# Patient Record
Sex: Female | Born: 1980 | Race: Black or African American | Hispanic: No | Marital: Married | Smoking: Never smoker
Health system: Southern US, Community
[De-identification: ages and names within clinical notes are randomized; demographics above are authoritative.]

## PROBLEM LIST (undated history)

## (undated) DIAGNOSIS — G43909 Migraine, unspecified, not intractable, without status migrainosus: Secondary | ICD-10-CM

## (undated) HISTORY — DX: Migraine, unspecified, not intractable, without status migrainosus: G43.909

## (undated) HISTORY — PX: KNEE ARTHROSCOPY W/ ACL RECONSTRUCTION: SHX1858

---

## 2019-03-07 ENCOUNTER — Other Ambulatory Visit: Payer: Self-pay

## 2019-03-07 ENCOUNTER — Encounter: Payer: Self-pay | Admitting: Obstetrics and Gynecology

## 2019-03-07 ENCOUNTER — Ambulatory Visit: Payer: Managed Care, Other (non HMO) | Admitting: Obstetrics and Gynecology

## 2019-03-07 VITALS — BP 115/79 | HR 88 | Ht 62.0 in | Wt 189.0 lb

## 2019-03-07 DIAGNOSIS — Z6834 Body mass index (BMI) 34.0-34.9, adult: Secondary | ICD-10-CM

## 2019-03-07 DIAGNOSIS — Z86018 Personal history of other benign neoplasm: Secondary | ICD-10-CM | POA: Diagnosis not present

## 2019-03-07 DIAGNOSIS — Z3169 Encounter for other general counseling and advice on procreation: Secondary | ICD-10-CM | POA: Diagnosis not present

## 2019-03-07 DIAGNOSIS — N926 Irregular menstruation, unspecified: Secondary | ICD-10-CM | POA: Diagnosis not present

## 2019-03-07 NOTE — Patient Instructions (Signed)
Thank you for enrolling in [facility]. Please follow the instructions below to securely access your online medical record. MyChart allows you to send messages to your doctor, view your test results, renew your prescriptions, schedule appointments, and more.  How Do I Sign Up? 1. In your Internet browser, go to [email or web address]. 2. Click on the New  User? link in the Sign In box.  3. Enter your MyChart Access Code exactly as it appears below. You will not need to use this code after you have completed the sign-up process. If you do not sign up before the expiration date, you must request a new code. MyChart Access Code: [email or web address] Expires: 04/21/2019  3:16 PM  4. Enter the last four digits of your Social Security Number (xxxx) and Date of Birth (mm/dd/yyyy) as indicated and click Next. You will be taken to the next sign-up page. 5. Create a MyChart ID. This will be your MyChart login ID and cannot be changed, so think of one that is secure and easy to remember. 6. Create a MyChart password. You can change your password at any time. 7. Enter your Password Reset Question and Answer and click Next. This can be used at a later time if you forget your password.  8. Select your communication preference, and if applicable enter your e-mail address. You will receive e-mail notification when new information is available in MyChart by choosing to receive e-mail notifications and filling in your e-mail. 9. Click Sign In. You can now view your medical record.   Additional Information If you have questions, you can email [email or web address] or call [phone] to talk to our [facility] staff. Remember, MyChart is NOT to be used for urgent needs. For medical emergencies, dial 911.   Preparing for Pregnancy If you are considering becoming pregnant, make an appointment to see your regular health care provider to learn how to prepare for a safe and healthy pregnancy  (preconception care). During a preconception care visit, your health care provider will:  Do a complete physical exam, including a Pap test.  Take a complete medical history.  Give you information, answer your questions, and help you resolve problems. Preconception checklist Medical history  Tell your health care provider about any current or past medical conditions. Your pregnancy or your ability to become pregnant may be affected by chronic conditions, such as diabetes, chronic hypertension, and thyroid problems.  Include your family's medical history as well as your partner's medical history.  Tell your health care provider about any history of STIs (sexually transmitted infections).These can affect your pregnancy. In some cases, they can be passed to your baby. Discuss any concerns that you have about STIs.  If indicated, discuss the benefits of genetic testing. This testing will show whether there are any genetic conditions that may be passed from you or your partner to your baby.  Tell your health care provider about: ? Any problems you have had with conception or pregnancy. ? Any medicines you take. These include vitamins, herbal supplements, and over-the-counter medicines. ? Your history of immunizations. Discuss any vaccinations that you may need. Diet  Ask your health care provider what to include in a healthy diet that has a balance of nutrients. This is especially important when you are pregnant or preparing to become pregnant.  Ask your health care provider to help you reach a healthy weight before pregnancy. ? If you are overweight, you may be at higher risk for certain complications, such as  high blood pressure, diabetes, and preterm birth. ? If you are underweight, you are more likely to have a baby who has a low birth weight. Lifestyle, work, and home  Let your health care provider know: ? About any lifestyle habits that you have, such as alcohol use, drug use, or  smoking. ? About recreational activities that may put you at risk during pregnancy, such as downhill skiing and certain exercise programs. ? Tell your health care provider about any international travel, especially any travel to places with an active [place] virus outbreak. ? About harmful substances that you may be exposed to at work or at home. These include chemicals, pesticides, radiation, or even litter boxes. ? If you do not feel safe at home. Mental health  Tell your health care provider about: ? Any history of mental health conditions, including feelings of depression, sadness, or anxiety. ? Any medicines that you take for a mental health condition. These include herbs and supplements. Home instructions to prepare for pregnancy Lifestyle   Eat a balanced diet. This includes fresh fruits and vegetables, whole grains, lean meats, low-fat dairy products, healthy fats, and foods that are high in fiber. Ask to meet with a nutritionist or registered dietitian for assistance with meal planning and goals.  Get regular exercise. Try to be active for at least 30 minutes a day on most days of the week. Ask your health care provider which activities are safe during pregnancy.  Do not use any products that contain nicotine or tobacco, such as cigarettes and e-cigarettes. If you need help quitting, ask your health care provider.  Do not drink alcohol.  Do not take illegal drugs.  Maintain a healthy weight. Ask your health care provider what weight range is right for you. General instructions  Keep an accurate record of your menstrual periods. This makes it easier for your health care provider to determine your baby's due date.  Begin taking prenatal vitamins and folic acid supplements daily as directed by your health care provider.  Manage any chronic conditions, such as high blood pressure and diabetes, as told by your health care provider. This is important. How do I know that I am pregnant?  You may be pregnant if you have been sexually active and you miss your period. Symptoms of early pregnancy include:  Mild cramping.  Very light vaginal bleeding (spotting).  Feeling unusually tired.  Nausea and vomiting (morning sickness). If you have any of these symptoms and you suspect that you might be pregnant, you can take a home pregnancy test. These tests check for a hormone in your urine (human chorionic gonadotropin, or hCG). A woman's body begins to make this hormone during early pregnancy. These tests are very accurate. Wait until at least the first day after you miss your period to take one. If the test shows that you are pregnant (you get a positive result), call your health care provider to make an appointment for prenatal care. What should I do if I become pregnant?      Make an appointment with your health care provider as soon as you suspect you are pregnant.  Do not use any products that contain nicotine, such as cigarettes, chewing tobacco, and e-cigarettes. If you need help quitting, ask your health care provider.  Do not drink alcoholic beverages. Alcohol is related to a number of birth defects.  Avoid toxic odors and chemicals.  You may continue to have sexual intercourse if it does not cause pain or  other problems, such as vaginal bleeding. This information is not intended to replace advice given to you by your health care provider. Make sure you discuss any questions you have with your health care provider. Document Released: 09/01/2008 Document Revised: 09/21/2017 Document Reviewed: 04/10/2016 Elsevier Interactive Patient Education  2019 [employer] Inc. Uterine Fibroids  Uterine fibroids are lumps of tissue (tumors) in your womb (uterus). They are not cancer (are benign). Most women with this condition do not need treatment. Sometimes fibroids can affect your ability to have children (your fertility). If that happens, you may need surgery to take out the  fibroids. Follow these instructions at home:  Take over-the-counter and prescription medicines only as told by your doctor. Your doctor may suggest NSAIDs (such as aspirin or ibuprofen) to help with pain.  Ask your doctor if you should: ? Take iron pills. ? Eat more foods that have iron in them, such as dark green, leafy vegetables.  If directed, apply heat to your back or belly to reduce pain. Use the heat source that your doctor recommends, such as a moist heat pack or a heating pad. ? Put a towel between your skin and the heat source. ? Leave the heat on for 20-30 minutes. ? Remove the heat if your skin turns bright red. This is especially important if you are unable to feel pain, heat, or cold. You may have a greater risk of getting burned.  Pay close attention to your period (menstrual) cycles. Tell your doctor about any changes, such as: ? A heavier blood flow than usual. ? Needing to use more pads or tampons than normal. ? A change in how many days your period lasts. ? A change in symptoms that come with your period, such as cramps or back pain.  Keep all follow-up visits as told by your doctor. This is important. Your doctor may need to watch your fibroids over time for any changes. Contact a doctor if you:  Have pain that does not get better with medicine or heat, such as pain or cramps in: ? Your back. ? The area between your hip bones (pelvic area). ? Your belly.  Have new bleeding between your periods.  Have more bleeding during or between your periods.  Feel very tired or weak.  Feel light-headed. Get help right away if you:  Pass out (faint).  Have pain in the area between your hip bones that suddenly gets worse.  Have bleeding that soaks a tampon or pad in 30 minutes or less. Summary  Uterine fibroids are lumps of tissue (tumors) in your womb (uterus). They are not cancer.  The only treatment that most women need is taking aspirin or ibuprofen for pain.   Contact a doctor if you have pain or cramps that do not get better with medicine.  Make sure you know what symptoms you should get help for right away. This information is not intended to replace advice given to you by your health care provider. Make sure you discuss any questions you have with your health care provider. Document Released: 10/22/2010 Document Revised: 08/15/2017 Document Reviewed: 08/15/2017 Elsevier Interactive Patient Education  2019 [employer].

## 2019-03-07 NOTE — Progress Notes (Signed)
  Subjective:     Patient ID: Kaitlyn Pierce, female   DOB: 1981/06/12, 38 y.o.   MRN: [id number]  HPI Here to discuss achieving pregnancy.has been married for 2 years. They are having sex daily if not multiple times a day around suspected ovulation. Has been trying to achieve for the last 3 months. Spouse has 2 children from previous marriage and he has no health issues. Patient had a very early miscarriage last year after taking antibiotics, with no other pregnancies.  Reports monthly menses that typically last 3-5 days. Does state May menses was light pink spotting that was 3 days late. Negative UPT on May 16th.     Reports onset menarche at age 43 with normal cycles.   She was told she had uterine fibroids 3 years ago by PCP in [place]. Had a Mirena that removed in 2018. Used non-latex condoms until last year.  Reports re-occurring yeast infections. Itching Worse after sex.does not feel like she has one today  Review of Systems  Genitourinary: Positive for menstrual problem.  All other systems reviewed and are negative.      Objective:   Physical Exam A&Ox4 Well groomed female in no distress Blood pressure 115/79, pulse 88, height 5\' 2"  (1.575 m), weight 189 lb (85.7 kg), last menstrual period 02/17/2019. Body mass index is 34.57 kg/m.  Thyroid not enlarged Pelvic exam: VULVA: normal appearing vulva with no masses, tenderness or lesions, VAGINA: normal appearing vagina with normal color and discharge, no lesions, CERVIX: normal appearing cervix without discharge or lesions, UTERUS: enlarged to 16 week's size, irregular, with suspected fibroid palpated anteriorly, ADNEXA: normal adnexa in size, nontender and no masses. Microscopic wet-mount exam shows negative for pathogens, normal epithelial cells. UPT-    Assessment:     Encounter for preconception counseling BMI 34 H/o uterine fibroids Missed menses    Plan:     Labs obtained and will follow up accordingly.  Pelvic  ultrasound ordered. Instructed to continue with sex until follow up appointment. Discussed common causes in difficulty achieving pregnancy. Discussed fibroids and complications associated with them. Will discuss futher after ultrasound and labs return.    Tinnie Kunin,CNM

## 2019-03-08 LAB — CBC
Hematocrit: 36.7 % (ref 34.0–46.6)
Hemoglobin: 12.6 g/dL (ref 11.1–15.9)
MCH: 30.5 pg (ref 26.6–33.0)
MCHC: 34.3 g/dL (ref 31.5–35.7)
MCV: 89 fL (ref 79–97)
Platelets: 200 10*3/uL (ref 150–450)
RBC: 4.13 x10E6/uL (ref 3.77–5.28)
RDW: 12.6 % (ref 11.7–15.4)
WBC: 5.2 10*3/uL (ref 3.4–10.8)

## 2019-03-08 LAB — COMPREHENSIVE METABOLIC PANEL
ALT: 11 IU/L (ref 0–32)
AST: 14 IU/L (ref 0–40)
Albumin/Globulin Ratio: 1.5 (ref 1.2–2.2)
Albumin: 4 g/dL (ref 3.8–4.8)
Alkaline Phosphatase: 77 IU/L (ref 39–117)
BUN/Creatinine Ratio: 13 (ref 9–23)
BUN: 10 mg/dL (ref 6–20)
Bilirubin Total: 0.2 mg/dL (ref 0.0–1.2)
CO2: 23 mmol/L (ref 20–29)
Calcium: 9.2 mg/dL (ref 8.7–10.2)
Chloride: 103 mmol/L (ref 96–106)
Creatinine, Ser: 0.8 mg/dL (ref 0.57–1.00)
GFR calc Af Amer: 107 mL/min/{1.73_m2} (ref >59)
GFR calc non Af Amer: 93 mL/min/{1.73_m2} (ref >59)
Globulin, Total: 2.6 g/dL (ref 1.5–4.5)
Glucose: 86 mg/dL (ref 65–99)
Potassium: 4.2 mmol/L (ref 3.5–5.2)
Sodium: 136 mmol/L (ref 134–144)
Total Protein: 6.6 g/dL (ref 6.0–8.5)

## 2019-03-08 LAB — BETA HCG QUANT (REF LAB): hCG Quant: <1 m[IU]/mL

## 2019-03-08 LAB — DHEA-SULFATE: DHEA-SO4: 113.3 ug/dL (ref 57.3–279.2)

## 2019-03-08 LAB — PROGESTERONE: Progesterone: 13.3 ng/mL

## 2019-03-08 LAB — FSH/LH
FSH: 2.6 m[IU]/mL
LH: 2.5 m[IU]/mL

## 2019-03-08 LAB — HEMOGLOBIN A1C
Est. average glucose Bld gHb Est-mCnc: 117 mg/dL
Hgb A1c MFr Bld: 5.7 % — ABNORMAL HIGH (ref 4.8–5.6)

## 2019-03-11 ENCOUNTER — Telehealth: Payer: Self-pay

## 2019-03-11 NOTE — Telephone Encounter (Signed)
Patient informed of MSs orders. Info sent and pt was sent the mychart sign up link.

## 2019-04-03 ENCOUNTER — Telehealth: Payer: Self-pay

## 2019-04-03 NOTE — Telephone Encounter (Signed)
Coronavirus (COVID-19) Are you at risk?  Are you at risk for the Coronavirus (COVID-19)?  To be considered HIGH RISK for Coronavirus (COVID-19), you have to meet the following criteria:  . Traveled to [place], [place], [place], [place] or [place]; or in the [place] to [place], [place], [place], or [place]; and have fever, cough, and shortness of breath within the last 2 weeks of travel OR . Been in close contact with a person diagnosed with COVID-19 within the last 2 weeks and have fever, cough, and shortness of breath . IF YOU DO NOT MEET THESE CRITERIA, YOU ARE CONSIDERED LOW RISK FOR COVID-19.  What to do if you are HIGH RISK for COVID-19?  . If you are having a medical emergency, call 911. . Seek medical care right away. Before you go to a doctor's office, urgent care or emergency department, call ahead and tell them about your recent travel, contact with someone diagnosed with COVID-19, and your symptoms. You should receive instructions from your physician's office regarding next steps of care.  . When you arrive at healthcare provider, tell the healthcare staff immediately you have returned from visiting [place], [place], [place], [place] or [place]; or traveled in the [place] to [place], [place], [place], or [place]; in the last two weeks or you have been in close contact with a person diagnosed with COVID-19 in the last 2 weeks.   . Tell the health care staff about your symptoms: fever, cough and shortness of breath. . After you have been seen by a medical provider, you will be either: o Tested for (COVID-19) and discharged home on quarantine except to seek medical care if symptoms worsen, and asked to  - Stay home and avoid contact with others until you get your results (4-5 days)  - Avoid travel on public transportation if possible (such as bus, train, or airplane) or o Sent to the Emergency Department by EMS for evaluation, COVID-19 testing, and possible  admission depending on your condition and test results.  What to do if you are LOW RISK for COVID-19?  Reduce your risk of any infection by using the same precautions used for avoiding the common cold or flu:  . Wash your hands often with soap and warm water for at least 20 seconds.  If soap and water are not readily available, use an alcohol-based hand sanitizer with at least 60% alcohol.  . If coughing or sneezing, cover your mouth and nose by coughing or sneezing into the elbow areas of your shirt or coat, into a tissue or into your sleeve (not your hands). . Avoid shaking hands with others and consider head nods or verbal greetings only. . Avoid touching your eyes, nose, or mouth with unwashed hands.  . Avoid close contact with people who are sick. . Avoid places or events with large numbers of people in one location, like concerts or sporting events. . Carefully consider travel plans you have or are making. . If you are planning any travel outside or inside the [place], visit the CDC's Travelers' Health webpage for the latest health notices. . If you have some symptoms but not all symptoms, continue to monitor at home and seek medical attention if your symptoms worsen. . If you are having a medical emergency, call 911.   ADDITIONAL HEALTHCARE OPTIONS FOR PATIENTS  [facility] / e-Visit: [email or web address]         [facility]: [phone]  [place]   Urgent Care: [facility]: [phone]   Prescreened neg cm

## 2019-04-04 ENCOUNTER — Ambulatory Visit (INDEPENDENT_AMBULATORY_CARE_PROVIDER_SITE_OTHER): Payer: Managed Care, Other (non HMO)

## 2019-04-04 ENCOUNTER — Encounter: Payer: Self-pay | Admitting: Obstetrics and Gynecology

## 2019-04-04 ENCOUNTER — Ambulatory Visit: Payer: Managed Care, Other (non HMO) | Admitting: Obstetrics and Gynecology

## 2019-04-04 ENCOUNTER — Other Ambulatory Visit: Payer: Self-pay

## 2019-04-04 VITALS — BP 128/73 | HR 109 | Ht 62.0 in | Wt 191.4 lb

## 2019-04-04 DIAGNOSIS — D251 Intramural leiomyoma of uterus: Secondary | ICD-10-CM

## 2019-04-04 DIAGNOSIS — D250 Submucous leiomyoma of uterus: Secondary | ICD-10-CM | POA: Diagnosis not present

## 2019-04-04 DIAGNOSIS — N83201 Unspecified ovarian cyst, right side: Secondary | ICD-10-CM | POA: Diagnosis not present

## 2019-04-04 DIAGNOSIS — Z86018 Personal history of other benign neoplasm: Secondary | ICD-10-CM | POA: Diagnosis not present

## 2019-04-04 DIAGNOSIS — R7303 Prediabetes: Secondary | ICD-10-CM

## 2019-04-04 DIAGNOSIS — N83202 Unspecified ovarian cyst, left side: Secondary | ICD-10-CM

## 2019-04-04 NOTE — Progress Notes (Signed)
  Subjective:     Patient ID: Aram Candela, female   DOB: [birth year], 38 y.o.   MRN: [id number]  HPI Here for pelvic ultrasound and to review labs. Was seen 4 weeks ago for preconception counseling. Please see note from 03/07/2019.  Review of Systems     Objective:   Physical Exam A&Ox4 Well groomed female in no distress    Pelvic u/s today reveals: Findings:  The uterus is anteverted and measures 13.7 x 7.5 x 8.0 cm. Echo texture is heterogenous with evidence of focal masses. Within the uterus are multiple suspected fibroids measuring:  Fibroid 1:Anterior IM 4.5 x 5.3 x 4.1 cm Fibroid 2:Posterior IM 4.1 x 3.0 x 3.6 cm Fibroid 3: Fundal SS 6.6 x 5.4 x 6.7 cm  The Endometrium measures 9.7 mm.  Right Ovary measures 3.6 x 1.9 x 3.1 cm. It is normal in appearance.Cyst measuring 19 mm. Left Ovary measures 3.4 x 2.9 x 2.7 cm. It is normal in appearance.Cyst measuring 21 mm. Survey of the adnexa demonstrates no adnexal masses. There is no free fluid in the cul de sac.  Impression: 1.Enlarge uterus is anteverted and measures 13.7 x 7.5 x 8.0 cm. 2.Echo texture is heterogenous with evidence of focal masses as described above. 3. Bilatal simple ovarian cysts.  Assessment:     Pre-diabetes. Multiple uterine fibroids Bilateral ovarian cyst    Plan:     Counseled on lab and ultrasound findings. Discussed uterine fibroids in pregnancy and their influence in getting pregnant, treatment options discussed and patient will consider and discuss with husband. Information given on UFE at [facility]. Counseled on ovarian cyst and anovulation causing possible need for clomid stimulation.  But recommend attending to fibroids before clomid use.  Counseled on pre-diabetes (has a brother with juvenile diabetes and MGF with type 2 DM). Is working on diet modifications and weight loss for prevention.  She will let me know within a month what she desires to do about  fibroids.  Kayelynn Abdou,CNM

## 2019-04-04 NOTE — Patient Instructions (Signed)

## 2019-05-21 ENCOUNTER — Other Ambulatory Visit: Payer: Self-pay | Admitting: *Deleted

## 2019-05-21 ENCOUNTER — Telehealth: Payer: Self-pay | Admitting: Obstetrics and Gynecology

## 2019-05-21 NOTE — Telephone Encounter (Signed)
The patient called and stated that she needs to speak with Amy or her provider in regards to her not hearing anything back after her last appointment. Please advise.

## 2019-05-21 NOTE — Telephone Encounter (Signed)
Referral done

## 2019-09-24 ENCOUNTER — Other Ambulatory Visit: Payer: Self-pay

## 2019-12-16 NOTE — Progress Notes (Signed)
[id number]    Provider:  Dr Jaynee Eagles Requesting Provider: Lanelle Bal, PA-C Primary Care Provider:  Lanelle Bal, PA-C  CC:  Migraines  HPI:  Kaitlyn Pierce is a 39 y.o. female here as requested by Lanelle Bal, PA-C for migraines. I reviewed Lanelle Bal, PA-C's notes: Patient has a history of migraines.  Migraines are worse during her menstrual periods.  She is to have bad headaches about every 6 months, started much more frequently postpartum, has a dull headache every day and a bad headache each month during her.,  Diagnosed with menstrual migraines not intractable without status migrainosus, the plan was to change her birth control and if this did not help start continuous low-dose combination birth control, and they referred her to neurology.  She started having migraines around the age of 61, she was diagnosed with chronic migraines. Grandmother had migraines, also grandfather. She moved here 2 years ago, slowly progressive, she had a migraine for 11 days which prednisone, prednisone helped. She has been on amitriptyline and sumatriptan as needed. If she can catch it early she can treat. Smells will trigger. Migraines usually on the right, behind the eye, feels like a drill, pounding, throbbing, they can be severe, even movement makes it worse, she has phonophobia/photophobia she had transitional and anti-glare glasses that help during a migraine, she has blurry vision on the right, she also tried topamax and she had word-finding difficulties she was on 200mg .  She also tried propranolol with side effects. She has had "every test" including images of the rain and all normal, nothing has significantly changed since then as far as quality and severity. She has 16 headache days a month, and 8 moderately-severe or severe migraines.No aura. No medication overuse.  Chocolate triggers. No other focal neurologic deficits, associated symptoms, inciting events or modifiable  factors.  Tried: imitrex, maxalt, topamax(side effects word-finding), amitriptyline, fioricet, propranolol(side effects passing out), ubrelvy(worked great but had allergic reaction, she had lip swelling)  hgba1c 5.7, cmp normal  Review of Systems: Patient complains of symptoms per HPI as well as the following symptoms: headachess. Pertinent negatives and positives per HPI. All others negative.   Social History   Socioeconomic History  . Marital status: Married    Spouse name: Not on file  . Number of children: Not on file  . Years of education: Not on file  . Highest education level: Bachelor's degree (e.g., BA, AB, BS)  Occupational History  . Not on file  Tobacco Use  . Smoking status: Never Smoker  . Smokeless tobacco: Never Used  Substance and Sexual Activity  . Alcohol use: Not Currently  . Drug use: Never  . Sexual activity: Yes    Birth control/protection: None  Other Topics Concern  . Not on file  Social History Narrative   Lives at home with her husband   Right handed   Caffeine: minimal, when she has a migraine she may drink some caffeine. Otherwise no caffeine.    Social Determinants of Health   Financial Resource Strain:   . Difficulty of Paying Living Expenses:   Food Insecurity:   . Worried About Charity fundraiser in the Last Year:   . Arboriculturist in the Last Year:   Transportation Needs:   . Film/video editor (Medical):   Marland Kitchen Lack of Transportation (Non-Medical):   Physical Activity:   . Days of Exercise per Week:   . Minutes of Exercise per Session:   Stress:   .  Feeling of Stress :   Social Connections:   . Frequency of Communication with Friends and Family:   . Frequency of Social Gatherings with Friends and Family:   . Attends Religious Services:   . Active Member of Clubs or Organizations:   . Attends Archivist Meetings:   Marland Kitchen Marital Status:   Intimate Partner Violence:   . Fear of Current or Ex-Partner:   . Emotionally  Abused:   Marland Kitchen Physically Abused:   . Sexually Abused:     Family History  Problem Relation Age of Onset  . Hypertension Mother   . COPD Mother        smoker  . Diabetes type I Brother        diagnosed at 33 months old   . Seizures Brother        grand mal  . Asthma Brother   . Hypertension Brother   . Heart attack Brother        CABG x 3  . Migraines Maternal Grandmother        until she had a her first child   . Narcolepsy Maternal Grandfather     Past Medical History:  Diagnosis Date  . Migraines     Patient Active Problem List   Diagnosis Date Noted  . Chronic migraine without aura with status migrainosus, not intractable 12/17/2019    Past Surgical History:  Procedure Laterality Date  . KNEE ARTHROSCOPY W/ ACL RECONSTRUCTION Bilateral     Current Outpatient Medications  Medication Sig Dispense Refill  . amitriptyline (ELAVIL) 10 MG tablet Take 3 pills (30mg ) at bedtime. May increase by 10mg  each week until at 5 pills(50mg ) at bedtime. 150 tablet 6  . SUMAtriptan (IMITREX) 50 MG tablet Take 50 mg by mouth every 2 (two) hours as needed for migraine. May repeat in 2 hours if headache persists or recurs.    . SUMAtriptan (TOSYMRA) 10 MG/ACT SOLN Place 1 spray into the nose every hour. Maximum 3 sprays in one day. 6 each 11   No current facility-administered medications for this visit.    Allergies as of 12/17/2019 - Review Complete 12/17/2019  Allergen Reaction Noted  . Flagyl [metronidazole]  03/07/2019  . Latex  03/07/2019  . Mushroom extract complex  03/07/2019  . Ubrogepant  12/17/2019    Vitals: BP 127/83 (BP Location: Left Arm, Patient Position: Sitting)   Pulse 92   Temp (!) 96.9 F (36.1 C) Comment: taken at front  Ht 5\' 2"  (1.575 m)   Wt 205 lb (93 kg)   BMI 37.49 kg/m  Last Weight:  Wt Readings from Last 1 Encounters:  12/17/19 205 lb (93 kg)   Last Height:   Ht Readings from Last 1 Encounters:  12/17/19 5\' 2"  (1.575 m)     Physical  exam: Exam: Gen: NAD, conversant, well nourised, obese, well groomed                     CV: RRR, no MRG. No Carotid Bruits. No peripheral edema, warm, nontender Eyes: Conjunctivae clear without exudates or hemorrhage  Neuro: Detailed Neurologic Exam  Speech:    Speech is normal; fluent and spontaneous with normal comprehension.  Cognition:    The patient is oriented to person, place, and time;     recent and remote memory intact;     language fluent;     normal attention, concentration,     fund of knowledge Cranial Nerves:    The  pupils are equal, round, and reactive to light. The fundi are normal and spontaneous venous pulsations are present. Visual fields are full to finger confrontation. Extraocular movements are intact. Trigeminal sensation is intact and the muscles of mastication are normal. The face is symmetric. The palate elevates in the midline. Hearing intact. Voice is normal. Shoulder shrug is normal. The tongue has normal motion without fasciculations.   Coordination:  No dysmetria   Gait:    Normal native gait  Motor Observation:    No asymmetry, no atrophy, and no involuntary movements noted. Tone:    Normal muscle tone.    Posture:    Posture is normal. normal erect    Strength:    Strength is V/V in the upper and lower limbs.      Sensation: intact to LT     Reflex Exam:  DTR's:    Deep tendon reflexes in the upper and lower extremities are normal bilaterally.   Toes:    The toes are downgoing bilaterally.   Clonus:    Clonus is absent.    Assessment/Plan:  39 year old with chronic migraine without aura with status migrainosus not intractable. No red flags to suggest she needs MRI brain.   - not interested in having botox, she doesn't like needles. Discussed the CGRP medications, she declines for now. We discussed oral medications, increasing amitriptyline or starting something else like zonisamide or cymbalta or other. She would like to increase  amitriptyline.   - Will try Tosymra to see if this is quicker for migraine relief (she had allergic reaction to [place]).  Meds ordered this encounter  Medications  . SUMAtriptan (TOSYMRA) 10 MG/ACT SOLN    Sig: Place 1 spray into the nose every hour. Maximum 3 sprays in one day.    Dispense:  6 each    Refill:  11    Patient has copay card; she can have medication regardless of insurance approval or copay amount.  Marland Kitchen amitriptyline (ELAVIL) 10 MG tablet    Sig: Take 3 pills (30mg ) at bedtime. May increase by 10mg  each week until at 5 pills(50mg ) at bedtime.    Dispense:  150 tablet    Refill:  6    Cc: Lanelle Bal, PA-C,  Lanelle Bal, PA-C  Sarina Ill, MD  [facility] [address] [place], [place] [address]  Phone [phone] Fax [phone]

## 2019-12-17 ENCOUNTER — Encounter: Payer: Self-pay | Admitting: Neurology

## 2019-12-17 ENCOUNTER — Other Ambulatory Visit: Payer: Self-pay

## 2019-12-17 ENCOUNTER — Ambulatory Visit: Payer: Managed Care, Other (non HMO) | Admitting: Neurology

## 2019-12-17 VITALS — BP 127/83 | HR 92 | Temp 96.9°F | Ht 62.0 in | Wt 205.0 lb

## 2019-12-17 DIAGNOSIS — G43701 Chronic migraine without aura, not intractable, with status migrainosus: Secondary | ICD-10-CM

## 2019-12-17 MED ORDER — TOSYMRA 10 MG/ACT NA SOLN: 1.0000 | NASAL | 11 refills | Status: DC

## 2019-12-17 MED ORDER — AMITRIPTYLINE HCL 10 MG PO TABS: ORAL_TABLET | ORAL | 6 refills | Status: AC

## 2019-12-17 NOTE — Patient Instructions (Signed)
Increase Amitriptyline to 30mg . Can further increase to 40mg  and then 50mg  Tosymra acutely  Sumatriptan nasal spray What is this medicine? SUMATRIPTAN (soo ma TRIP tan) is used to treat migraines with or without aura. An aura is a strange feeling or visual disturbance that warns you of an attack. It is not used to prevent migraines. This medicine may be used for other purposes; ask your health care provider or pharmacist if you have questions. COMMON BRAND NAME(S): Imitrex, Tosymra What should I tell my health care provider before I take this medicine? They need to know if you have any of these conditions:  cigarette smoker  circulation problems in fingers and toes  diabetes  heart disease  high blood pressure  high cholesterol  history of irregular heartbeat  history of stroke  kidney disease  liver disease  stomach or intestine problems  an unusual or allergic reaction to sumatriptan, other medicines, foods, dyes, or preservatives  pregnant or trying to get pregnant  breast-feeding How should I use this medicine? This medicine is for use in the nose. Follow the directions on your product or prescription label. Do not use more often than directed. Make sure that you are using your nasal spray correctly. Ask your doctor or health care professional if you have any questions. Talk to your pediatrician regarding the use of this medicine in children. Special care may be needed. Overdosage: If you think you have taken too much of this medicine contact a poison control center or emergency room at once. NOTE: This medicine is only for you. Do not share this medicine with others. What if I miss a dose? This does not apply. This medicine is not for regular use. What may interact with this medicine? Do not take this medicine with any of the following medicines:  certain medicines for migraine headache like almotriptan, eletriptan, frovatriptan, naratriptan, rizatriptan,  sumatriptan, zolmitriptan  ergot alkaloids like dihydroergotamine, ergonovine, ergotamine, methylergonovine  MAOIs like Carbex, Eldepryl, Marplan, Nardil, and Parnate This medicine may also interact with the following medications:  certain medicines for depression, anxiety, or psychotic disorders This list may not describe all possible interactions. Give your health care provider a list of all the medicines, herbs, non-prescription drugs, or dietary supplements you use. Also tell them if you smoke, drink alcohol, or use illegal drugs. Some items may interact with your medicine. What should I watch for while using this medicine? Visit your healthcare professional for regular checks on your progress. Tell your healthcare professional if your symptoms do not start to get better or if they get worse. You may get drowsy or dizzy. Do not drive, use machinery, or do anything that needs mental alertness until you know how this medicine affects you. Do not stand up or sit up quickly, especially if you are an older patient. This reduces the risk of dizzy or fainting spells. Alcohol may interfere with the effect of this medicine. Tell your healthcare professional right away if you have any change in your eyesight. If you take migraine medicines for 10 or more days a month, your migraines may get worse. Keep a diary of headache days and medicine use. Contact your healthcare professional if your migraine attacks occur more frequently. What side effects may I notice from receiving this medicine? Side effects that you should report to your doctor or health care professional as soon as possible:  allergic reactions like skin rash, itching or hives, swelling of the face, lips, or tongue  changes in vision  chest pain or chest tightness  signs and symptoms of a dangerous change in heartbeat or heart rhythm like chest pain; dizziness; fast, irregular heartbeat; palpitations; feeling faint or lightheaded; falls;  breathing problems  signs and symptoms of a stroke like changes in vision; confusion; trouble speaking or understanding; severe headaches; sudden numbness or weakness of the face, arm or leg; trouble walking; dizziness; loss of balance or coordination  signs and symptoms of serotonin syndrome like irritable; confusion; diarrhea; fast or irregular heartbeat; muscle twitching; stiff muscles; trouble walking; sweating; high fever; seizures; chills; vomiting Side effects that usually do not require medical attention (report to your doctor or health care professional if they continue or are bothersome):  changes in taste  diarrhea  dizziness  drowsiness  dry mouth  headache  nausea, vomiting  pain, tingling, numbness in the hands or feet  stomach pain This list may not describe all possible side effects. Call your doctor for medical advice about side effects. You may report side effects to FDA at 1-800-FDA-1088. Where should I keep my medicine? Keep out of the reach of children. Store at room temperature between 2 and 30 degrees C (36 and 86 degrees F). Throw away any unused medicine after the expiration date. NOTE: This sheet is a summary. It may not cover all possible information. If you have questions about this medicine, talk to your doctor, pharmacist, or health care provider.  2020 Elsevier/Gold Standard (2018-04-03 15:08:07) Amitriptyline tablets What is this medicine? AMITRIPTYLINE (a mee TRIP ti leen) is used to treat depression. This medicine may be used for other purposes; ask your health care provider or pharmacist if you have questions. COMMON BRAND NAME(S): Elavil, Vanatrip What should I tell my health care provider before I take this medicine? They need to know if you have any of these conditions:  an alcohol problem  asthma, difficulty breathing  bipolar disorder or schizophrenia  difficulty passing urine, prostate trouble  glaucoma  heart disease or  previous heart attack  liver disease  over active thyroid  seizures  thoughts or plans of suicide, a previous suicide attempt, or family history of suicide attempt  an unusual or allergic reaction to amitriptyline, other medicines, foods, dyes, or preservatives  pregnant or trying to get pregnant  breast-feeding How should I use this medicine? Take this medicine by mouth with a drink of water. Follow the directions on the prescription label. You can take the tablets with or without food. Take your medicine at regular intervals. Do not take it more often than directed. Do not stop taking this medicine suddenly except upon the advice of your doctor. Stopping this medicine too quickly may cause serious side effects or your condition may worsen. A special MedGuide will be given to you by the pharmacist with each prescription and refill. Be sure to read this information carefully each time. Talk to your pediatrician regarding the use of this medicine in children. Special care may be needed. Overdosage: If you think you have taken too much of this medicine contact a poison control center or emergency room at once. NOTE: This medicine is only for you. Do not share this medicine with others. What if I miss a dose? If you miss a dose, take it as soon as you can. If it is almost time for your next dose, take only that dose. Do not take double or extra doses. What may interact with this medicine? Do not take this medicine with any of the following medications:  arsenic trioxide  certain medicines used to regulate abnormal heartbeat or to treat other heart conditions  cisapride  droperidol  halofantrine  linezolid  MAOIs like Carbex, Eldepryl, Marplan, Nardil, and Parnate  methylene blue  other medicines for mental depression  phenothiazines like perphenazine, thioridazine and chlorpromazine  pimozide  probucol  procarbazine  sparfloxacin  St. John's Wort This medicine may  also interact with the following medications:  atropine and related drugs like hyoscyamine, scopolamine, tolterodine and others  barbiturate medicines for inducing sleep or treating seizures, like phenobarbital  cimetidine  disulfiram  ethchlorvynol  thyroid hormones such as levothyroxine  ziprasidone This list may not describe all possible interactions. Give your health care provider a list of all the medicines, herbs, non-prescription drugs, or dietary supplements you use. Also tell them if you smoke, drink alcohol, or use illegal drugs. Some items may interact with your medicine. What should I watch for while using this medicine? Tell your doctor if your symptoms do not get better or if they get worse. Visit your doctor or health care professional for regular checks on your progress. Because it may take several weeks to see the full effects of this medicine, it is important to continue your treatment as prescribed by your doctor. Patients and their families should watch out for new or worsening thoughts of suicide or depression. Also watch out for sudden changes in feelings such as feeling anxious, agitated, panicky, irritable, hostile, aggressive, impulsive, severely restless, overly excited and hyperactive, or not being able to sleep. If this happens, especially at the beginning of treatment or after a change in dose, call your health care professional. Dennis Bast may get drowsy or dizzy. Do not drive, use machinery, or do anything that needs mental alertness until you know how this medicine affects you. Do not stand or sit up quickly, especially if you are an older patient. This reduces the risk of dizzy or fainting spells. Alcohol may interfere with the effect of this medicine. Avoid alcoholic drinks. Do not treat yourself for coughs, colds, or allergies without asking your doctor or health care professional for advice. Some ingredients can increase possible side effects. Your mouth may get dry.  Chewing sugarless gum or sucking hard candy, and drinking plenty of water will help. Contact your doctor if the problem does not go away or is severe. This medicine may cause dry eyes and blurred vision. If you wear contact lenses you may feel some discomfort. Lubricating drops may help. See your eye doctor if the problem does not go away or is severe. This medicine can cause constipation. Try to have a bowel movement at least every 2 to 3 days. If you do not have a bowel movement for 3 days, call your doctor or health care professional. This medicine can make you more sensitive to the sun. Keep out of the sun. If you cannot avoid being in the sun, wear protective clothing and use sunscreen. Do not use sun lamps or tanning beds/booths. What side effects may I notice from receiving this medicine? Side effects that you should report to your doctor or health care professional as soon as possible:  allergic reactions like skin rash, itching or hives, swelling of the face, lips, or tongue  anxious  breathing problems  changes in vision  confusion  elevated mood, decreased need for sleep, racing thoughts, impulsive behavior  eye pain  fast, irregular heartbeat  feeling faint or lightheaded, falls  feeling agitated, angry, or irritable  fever with increased  sweating  hallucination, loss of contact with reality  seizures  stiff muscles  suicidal thoughts or other mood changes  tingling, pain, or numbness in the feet or hands  trouble passing urine or change in the amount of urine  trouble sleeping  unusually weak or tired  vomiting  yellowing of the eyes or skin Side effects that usually do not require medical attention (report to your doctor or health care professional if they continue or are bothersome):  change in sex drive or performance  change in appetite or weight  constipation  dizziness  dry mouth  nausea  tired  tremors  upset stomach This list may  not describe all possible side effects. Call your doctor for medical advice about side effects. You may report side effects to FDA at 1-800-FDA-1088. Where should I keep my medicine? Keep out of the reach of children. Store at room temperature between 20 and 25 degrees C (68 and 77 degrees F). Throw away any unused medicine after the expiration date. NOTE: This sheet is a summary. It may not cover all possible information. If you have questions about this medicine, talk to your doctor, pharmacist, or health care provider.  2020 Elsevier/Gold Standard (2018-09-11 13:04:32)

## 2019-12-19 ENCOUNTER — Telehealth: Payer: Self-pay | Admitting: *Deleted

## 2019-12-19 ENCOUNTER — Encounter: Payer: Self-pay | Admitting: *Deleted

## 2019-12-19 NOTE — Telephone Encounter (Signed)
Tosymra PA completed on Cover My Meds. Key: [id number]. Awaiting determination.

## 2019-12-30 ENCOUNTER — Telehealth: Payer: Self-pay | Admitting: *Deleted

## 2019-12-30 NOTE — Telephone Encounter (Signed)
Amitriptyline 10 mg 90 day request form received and completed. Currently on titration schedule. Form signed by Dr. Jaynee Eagles and faxed back to [facility]. Received a receipt of confirmation.

## 2019-12-31 NOTE — Telephone Encounter (Signed)
Received fax from [facility] stating Amitriptyline is available without a PA. Case ID [id number].

## 2020-01-03 ENCOUNTER — Encounter: Payer: Self-pay | Admitting: *Deleted

## 2020-01-03 NOTE — Telephone Encounter (Signed)
Tosymra denied per [employer] d/t pt not meeting clinical requirements of generic sumatriptan nasal spray. Pt should still be able to use the savings card. I sent pt a message in mychart. If we need to send additional info, fax to [phone]. If we choose to appeal, fax within 180 days to [phone]. Request ID: [id number].

## 2020-01-06 NOTE — Telephone Encounter (Signed)
Received another fax stating Amitriptyline 10 mg tablet request approved through 03/02/20. I faxed this to the pt's pharmacy. Received a receipt of confirmation.

## 2020-03-27 ENCOUNTER — Telehealth: Payer: Self-pay | Admitting: Neurology

## 2020-03-27 NOTE — Telephone Encounter (Signed)
Pt has called to report that she is out of the medication she is to rub in her scalp.  Pt would like a call to know if she is supposed to continue to other medication(pt does not recall the name of either of the 2) Please call

## 2020-03-30 NOTE — Telephone Encounter (Signed)
I returned pt's call. She said she had thought she was calling the dermatologist's office. No action needed.

## 2020-06-01 ENCOUNTER — Telehealth: Payer: Self-pay | Admitting: Neurology

## 2020-06-01 MED ORDER — TOSYMRA 10 MG/ACT NA SOLN: 1.0000 | NASAL | 6 refills | Status: DC

## 2020-06-01 NOTE — Telephone Encounter (Signed)
Pt called stating that the coupon that she had for her SUMAtriptan (TOSYMRA) 10 MG/ACT SOLN has expired and is wanting to know if she can get another one. Please advise.

## 2020-06-01 NOTE — Addendum Note (Signed)
Addended by: Gildardo Griffes on: 06/01/2020 05:00 PM   Modules accepted: Orders

## 2020-06-03 NOTE — Telephone Encounter (Signed)
Tosymra appeal completed on Cover My Meds. Key: [id number]. Awaiting determination from [facility].

## 2020-06-22 ENCOUNTER — Ambulatory Visit: Payer: Managed Care, Other (non HMO) | Admitting: Neurology

## 2020-06-23 NOTE — Telephone Encounter (Signed)
We are continuing to receive PAs from the local pharmacy. I called Kait[facility] Pierce and was told that they have the medication currently for $0 for the patient and they have not quite contacted her but will call later today. They do have on file that the previous PA was denied but they have a savings program in place.

## 2020-07-02 ENCOUNTER — Encounter: Payer: Self-pay | Admitting: Obstetrics & Gynecology

## 2020-07-02 ENCOUNTER — Ambulatory Visit (INDEPENDENT_AMBULATORY_CARE_PROVIDER_SITE_OTHER): Payer: Managed Care, Other (non HMO) | Admitting: Obstetrics & Gynecology

## 2020-07-02 VITALS — BP 122/84 | HR 82 | Ht 62.0 in | Wt 201.0 lb

## 2020-07-02 DIAGNOSIS — D219 Benign neoplasm of connective and other soft tissue, unspecified: Secondary | ICD-10-CM | POA: Diagnosis not present
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
## 2020-07-20 ENCOUNTER — Other Ambulatory Visit: Payer: Managed Care, Other (non HMO)

## 2020-07-31 ENCOUNTER — Other Ambulatory Visit: Payer: Managed Care, Other (non HMO)

## 2020-08-26 ENCOUNTER — Ambulatory Visit: Payer: Managed Care, Other (non HMO) | Admitting: Neurology

## 2020-08-26 ENCOUNTER — Encounter: Payer: Self-pay | Admitting: Neurology

## 2020-09-04 ENCOUNTER — Other Ambulatory Visit: Payer: Self-pay

## 2020-09-04 ENCOUNTER — Ambulatory Visit
Admission: EM | Admit: 2020-09-04 | Discharge: 2020-09-04 | Disposition: A | Discharge status: Home / Self Care | Payer: Managed Care, Other (non HMO) | Attending: Emergency Medicine | Admitting: Emergency Medicine

## 2020-09-04 ENCOUNTER — Ambulatory Visit (INDEPENDENT_AMBULATORY_CARE_PROVIDER_SITE_OTHER): Payer: Managed Care, Other (non HMO)

## 2020-09-04 DIAGNOSIS — M25571 Pain in right ankle and joints of right foot: Secondary | ICD-10-CM

## 2020-09-04 DIAGNOSIS — M25471 Effusion, right ankle: Secondary | ICD-10-CM

## 2020-09-04 NOTE — ED Provider Notes (Signed)
[place]   [id number] 09/04/20 Arrival Time: [place]   Chief Complaint  Patient presents with  . Ankle Pain     SUBJECTIVE: History from: patient and family.  Kaitlyn Pierce is a 39 y.o. female who presented to the urgent care with a complaint of ankle pain that occurred this morning.  Developed the symptom after twisting her ankle.  She localized the pain to the right ankle.  She describes the pain as constant and achy.  She has tried OTC medications without relief.  Her symptoms are made worse with ROM.  She denies similar symptoms in the past.  Denies chills, fever, nausea, vomiting, diarrhea      ROS: As per HPI.  All other pertinent ROS negative.      Past Medical History:  Diagnosis Date  . Migraines    Past Surgical History:  Procedure Laterality Date  . KNEE ARTHROSCOPY W/ ACL RECONSTRUCTION Bilateral    Allergies  Allergen Reactions  . Flagyl [Metronidazole]     vertigo  . Latex   . Mushroom Extract Complex   . Ubrogepant     angioedema   No current facility-administered medications on file prior to encounter.   Current Outpatient Medications on File Prior to Encounter  Medication Sig Dispense Refill  . amitriptyline (ELAVIL) 10 MG tablet Take 3 pills (30mg ) at bedtime. May increase by 10mg  each week until at 5 pills(50mg ) at bedtime. 150 tablet 6  . SUMAtriptan (IMITREX) 50 MG tablet Take 50 mg by mouth every 2 (two) hours as needed for migraine. May repeat in 2 hours if headache persists or recurs.    . SUMAtriptan (TOSYMRA) 10 MG/ACT SOLN Place 1 spray into the nose every hour. Maximum 3 sprays in one day. 6 each 6   Social History   Socioeconomic History  . Marital status: Married    Spouse name: Not on file  . Number of children: Not on file  . Years of education: Not on file  . Highest education level: Bachelor's degree (e.g., BA, AB, BS)  Occupational History  . Not on file  Tobacco Use  . Smoking status: Never Smoker  . Smokeless  tobacco: Never Used  Vaping Use  . Vaping Use: Never used  Substance and Sexual Activity  . Alcohol use: Not Currently  . Drug use: Never  . Sexual activity: Yes    Birth control/protection: None  Other Topics Concern  . Not on file  Social History Narrative   Lives at home with her husband   Right handed   Caffeine: minimal, when she has a migraine she may drink some caffeine. Otherwise no caffeine.    Social Determinants of Health   Financial Resource Strain: Low Risk   . Difficulty of Paying Living Expenses: Not hard at all  Food Insecurity: No Food Insecurity  . Worried About Charity fundraiser in the Last Year: Never true  . Ran Out of Food in the Last Year: Never true  Transportation Needs: No Transportation Needs  . Lack of Transportation (Medical): No  . Lack of Transportation (Non-Medical): No  Physical Activity: Inactive  . Days of Exercise per Week: 0 days  . Minutes of Exercise per Session: 0 min  Stress: No Stress Concern Present  . Feeling of Stress : Only a little  Social Connections: Socially Integrated  . Frequency of Communication with Friends and Family: More than three times a week  . Frequency of Social Gatherings with Friends and Family: Never  .  Attends Religious Services: More than 4 times per year  . Active Member of Clubs or Organizations: Yes  . Attends Archivist Meetings: More than 4 times per year  . Marital Status: Married  Human resources officer Violence: Not At Risk  . Fear of Current or Ex-Partner: No  . Emotionally Abused: No  . Physically Abused: No  . Sexually Abused: No   Family History  Problem Relation Age of Onset  . Hypertension Mother   . COPD Mother        smoker  . Diabetes type I Brother        diagnosed at 43 months old   . Seizures Brother        grand mal  . Asthma Brother   . Hypertension Brother   . Heart attack Brother        CABG x 3  . Migraines Maternal Grandmother        until she had a her first  child   . Narcolepsy Maternal Grandfather     OBJECTIVE:  Vitals:   09/04/20 0842  BP: 114/68  Pulse: 84  Resp: 16  Temp: 98.3 F (36.8 C)  SpO2: 98%     Physical Exam Vitals and nursing note reviewed.  Constitutional:      General: She is not in acute distress.    Appearance: Normal appearance. She is normal weight. She is not ill-appearing, toxic-appearing or diaphoretic.  HENT:     Head: Normocephalic.  Cardiovascular:     Rate and Rhythm: Normal rate and regular rhythm.     Pulses: Normal pulses.     Heart sounds: Normal heart sounds. No murmur heard.  No friction rub. No gallop.   Pulmonary:     Effort: Pulmonary effort is normal. No respiratory distress.     Breath sounds: Normal breath sounds. No stridor. No wheezing, rhonchi or rales.  Chest:     Chest wall: No tenderness.  Musculoskeletal:        General: Tenderness present.     Right ankle: Swelling present. Tenderness present.     Left ankle: Normal.     Comments: The right ankle is with obvious deformity when compared to the left ankle.  Swelling tenderness present.  There is no ecchymosis, open wound, lesion, surface trauma or warmth present.  Normal range of motion with pain.  Neurovascular status intact  Neurological:     Mental Status: She is alert and oriented to person, place, and time.     LABS:  No results found for this or any previous visit (from the past 24 hour(s)).   RADIOLOGY:  DG Ankle Complete Right  Result Date: 09/04/2020 CLINICAL DATA:  Right ankle pain and swelling. EXAM: RIGHT ANKLE - COMPLETE 3+ VIEW COMPARISON:  None. FINDINGS: Mild soft swelling is present over the lateral malleolus. No underlying fracture is present. The joint is located. No definite joint effusion is present. Small calcaneal spur is evident. IMPRESSION: Mild soft tissue swelling over the lateral malleolus without underlying fracture. Electronically Signed   By: San Morelle M.D.   On: 09/04/2020 09:29    Right ankle X-ray is negative for bony abnormality including fracture or dislocation.  I have reviewed the x-ray myself and the radiologist interpretation.  I am in agreement with the radiologist interpretation.   ASSESSMENT & PLAN:  1. Acute right ankle pain     No orders of the defined types were placed in this encounter.   Discharge Instructions  Take OTC Tylenol/ibuprofen as needed for pain Follow RICE instruction that is attached Follow-up with PCP Return to to ED if you develop any new or worsening symptoms  Reviewed expectations re: course of current medical issues. Questions answered. Outlined signs and symptoms indicating need for more acute intervention. Patient verbalized understanding. After Visit Summary given.         Emerson Monte, [place] 09/04/20 [phone]

## 2020-09-04 NOTE — ED Notes (Signed)
Triage by provider

## 2020-09-04 NOTE — Discharge Instructions (Addendum)
Take OTC Tylenol/ibuprofen as needed for pain Follow RICE instruction that is attached Follow-up with PCP Return to to ED if you develop any new or worsening symptoms

## 2020-09-08 ENCOUNTER — Emergency Department (HOSPITAL_COMMUNITY): Payer: Managed Care, Other (non HMO)

## 2020-09-08 ENCOUNTER — Emergency Department (HOSPITAL_COMMUNITY)
Admission: EM | Admit: 2020-09-08 | Discharge: 2020-09-08 | Disposition: A | Discharge status: Home / Self Care | Payer: Managed Care, Other (non HMO) | Attending: Emergency Medicine | Admitting: Emergency Medicine

## 2020-09-08 ENCOUNTER — Encounter (HOSPITAL_COMMUNITY): Payer: Self-pay

## 2020-09-08 DIAGNOSIS — Z5321 Procedure and treatment not carried out due to patient leaving prior to being seen by health care provider: Secondary | ICD-10-CM | POA: Insufficient documentation

## 2020-09-08 DIAGNOSIS — M25561 Pain in right knee: Secondary | ICD-10-CM | POA: Insufficient documentation

## 2020-09-08 NOTE — ED Notes (Signed)
Pt called for vitals x3, no answer

## 2020-09-08 NOTE — ED Triage Notes (Signed)
Pt reports that she dropped something on her leg over the weekend went to [facility] and had ankle looked at, now R knee is hurting.

## 2020-09-08 NOTE — ED Notes (Signed)
Pt sitting in peds waiting area, phone

## 2020-10-21 NOTE — Telephone Encounter (Signed)
Received another Tosymra PA. I completed this on [employer]. Key: [id number]. Awaiting determination from [employer].

## 2020-10-27 ENCOUNTER — Encounter: Payer: Self-pay | Admitting: *Deleted

## 2020-11-16 ENCOUNTER — Other Ambulatory Visit: Payer: Self-pay | Admitting: *Deleted

## 2020-11-16 MED ORDER — TOSYMRA 10 MG/ACT NA SOLN: 1.0000 | NASAL | 1 refills | Status: AC

## 2021-01-26 IMAGING — DX DG KNEE COMPLETE 4+V*R*
4 series · 4 of 4 positions shown · non-contrast
Comparison: None.

CLINICAL DATA: Pain.

EXAM:
RIGHT KNEE - COMPLETE 4+ VIEW

[knee ap]
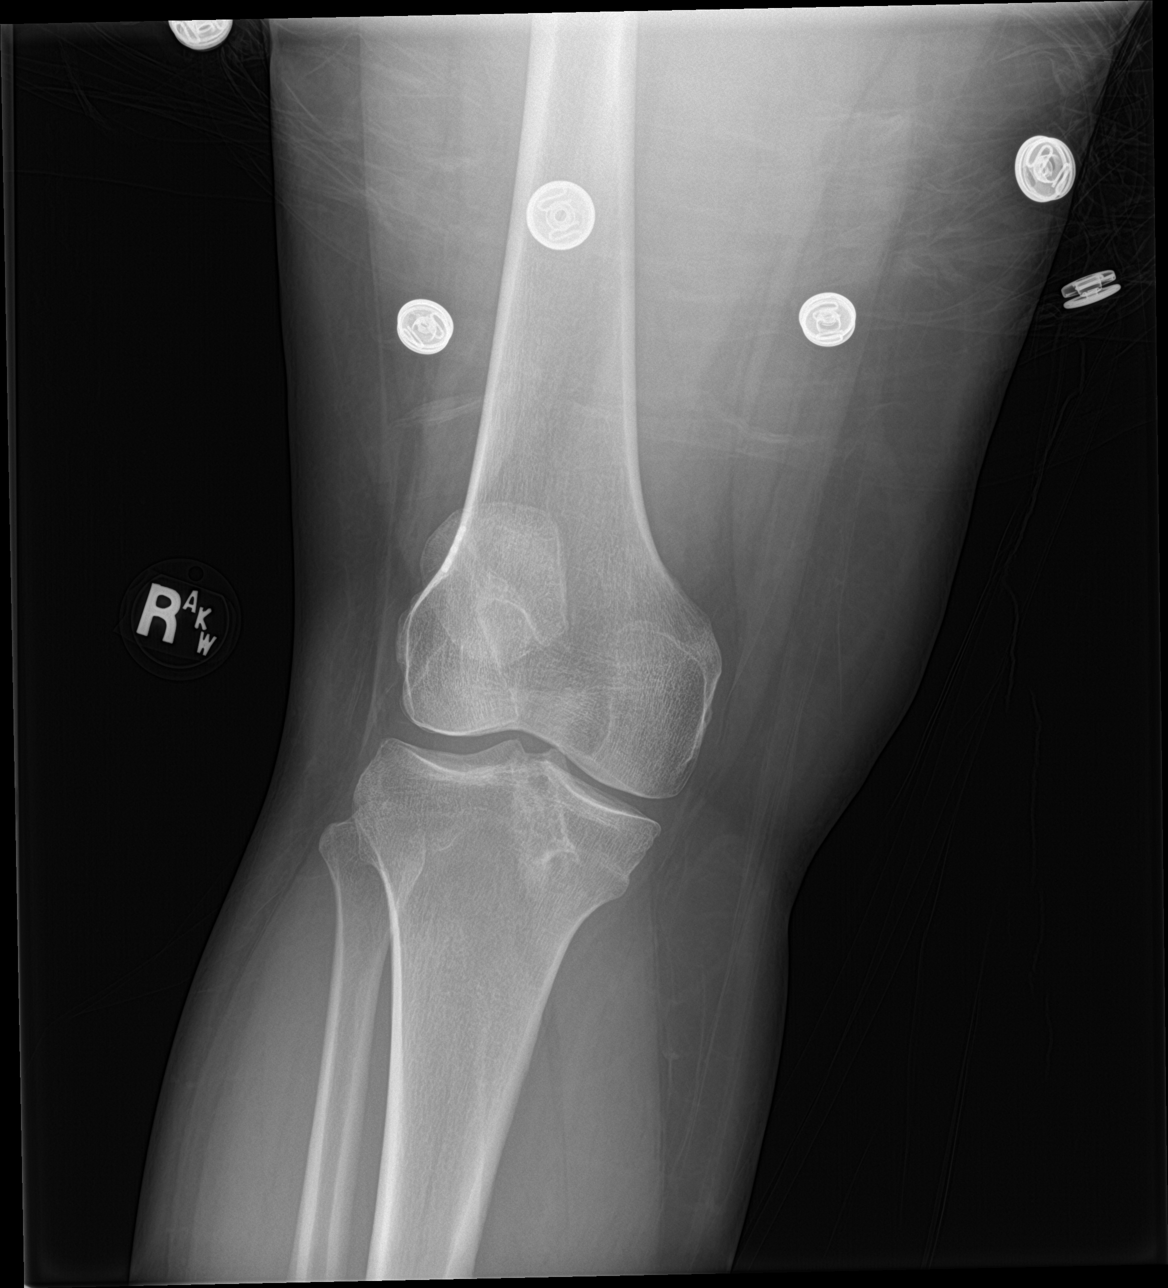

[knee lat]
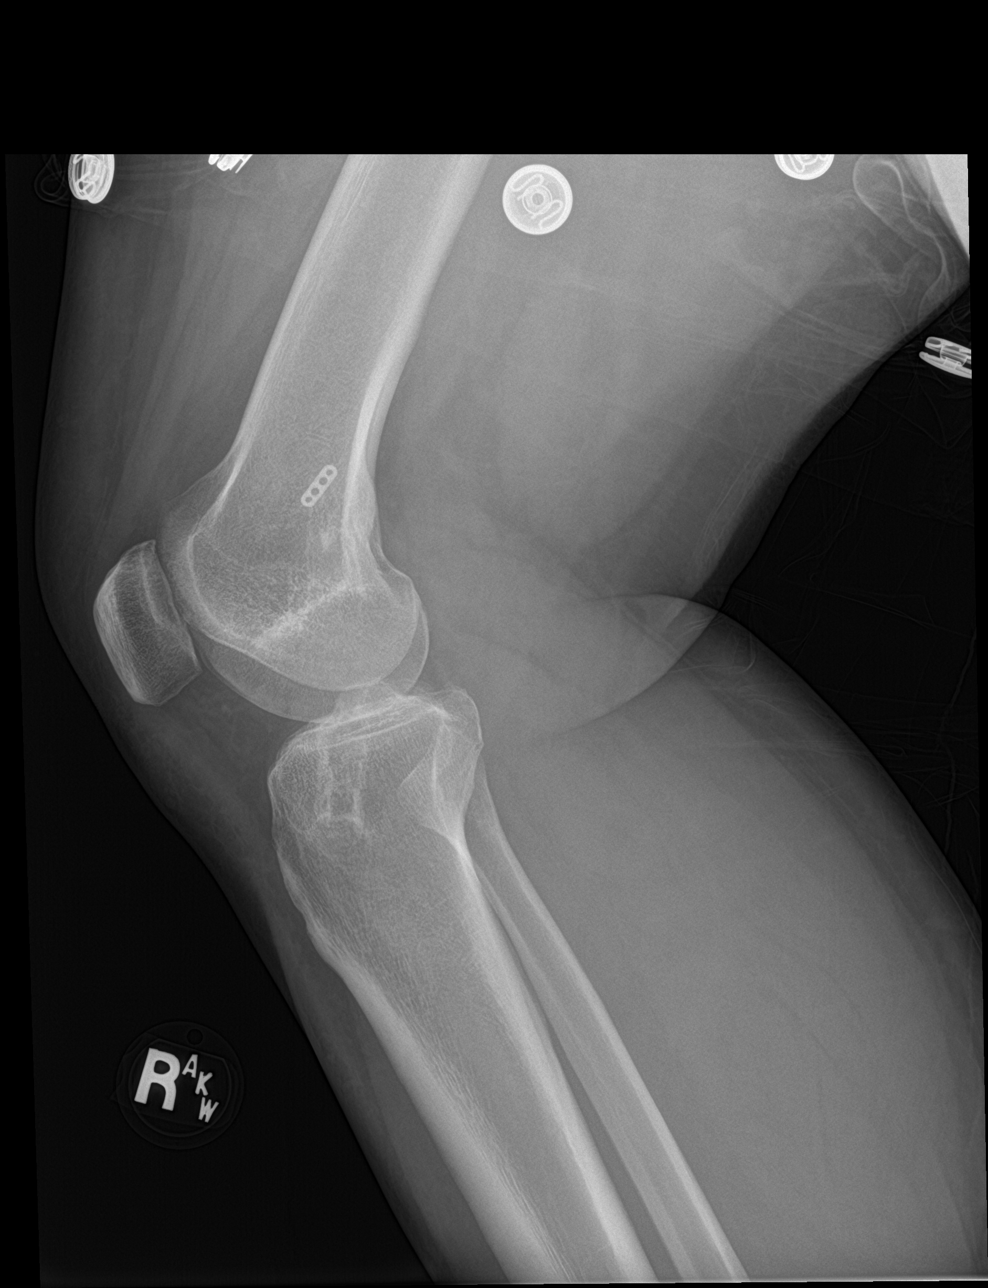

[knee obl (1 of 2)]
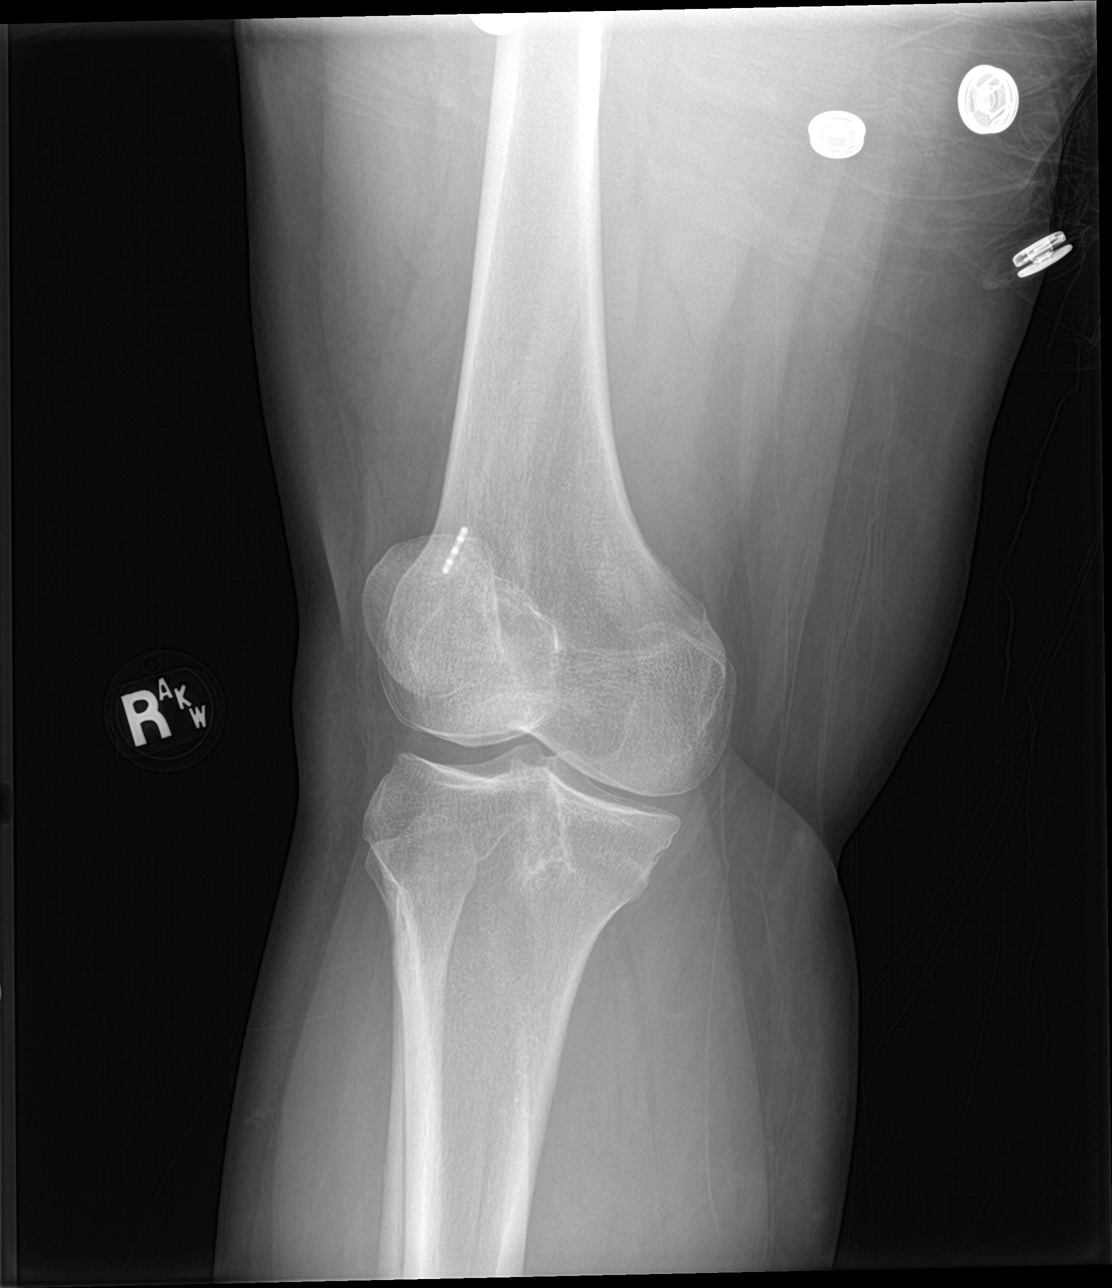

[knee obl (2 of 2)]
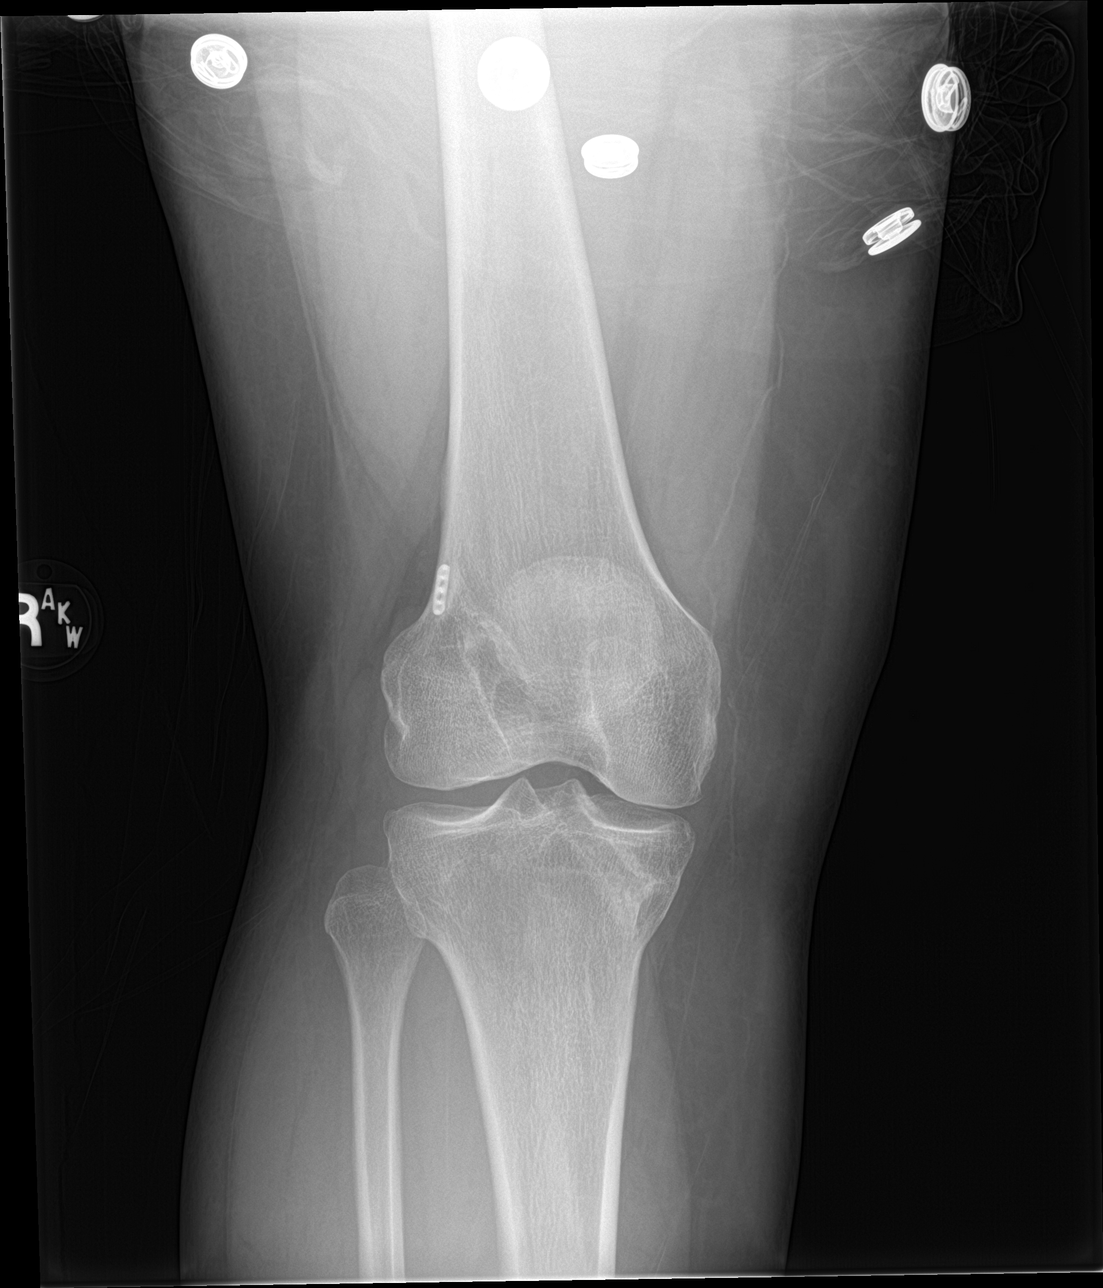

[4 of 4 positions shown; findings below may reference images not displayed]

FINDINGS: The patient is status post prior ACL repair. There are mild
tricompartmental degenerative changes of the right knee. There is no
significant joint effusion. There is no acute displaced fracture or
dislocation.
IMPRESSION: 1. No acute displaced fracture or dislocation.
2. Mild tricompartmental degenerative changes of the right knee.
